# Patient Record
Sex: Male | Born: 1980 | Race: White | Hispanic: No | Smoking: Current every day smoker
Health system: Southern US, Community
[De-identification: ages and names within clinical notes are randomized; demographics above are authoritative.]

---

## 2000-07-24 ENCOUNTER — Emergency Department (HOSPITAL_COMMUNITY): Admission: EM | Admit: 2000-07-24 | Discharge: 2000-07-24 | Payer: Self-pay | Admitting: *Deleted

## 2001-10-18 ENCOUNTER — Emergency Department (HOSPITAL_COMMUNITY): Admission: EM | Admit: 2001-10-18 | Discharge: 2001-10-18 | Payer: Self-pay | Admitting: *Deleted

## 2001-10-18 ENCOUNTER — Encounter: Payer: Self-pay | Admitting: *Deleted

## 2008-04-06 ENCOUNTER — Emergency Department (HOSPITAL_COMMUNITY): Admission: EM | Admit: 2008-04-06 | Discharge: 2008-04-06 | Payer: Self-pay | Admitting: Emergency Medicine

## 2009-09-03 ENCOUNTER — Emergency Department (HOSPITAL_COMMUNITY): Admission: EM | Admit: 2009-09-03 | Discharge: 2009-09-03 | Payer: Self-pay | Admitting: Emergency Medicine

## 2009-09-03 ENCOUNTER — Inpatient Hospital Stay (HOSPITAL_COMMUNITY): Admission: RE | Admit: 2009-09-03 | Discharge: 2009-09-06 | Payer: Self-pay | Admitting: Psychiatry

## 2009-09-03 ENCOUNTER — Ambulatory Visit: Payer: Self-pay | Admitting: Psychiatry

## 2010-05-12 ENCOUNTER — Emergency Department (HOSPITAL_COMMUNITY): Admission: EM | Admit: 2010-05-12 | Discharge: 2010-05-13 | Payer: Self-pay | Admitting: Emergency Medicine

## 2011-02-27 LAB — CBC
HCT: 47 % (ref 39.0–52.0)
MCV: 97.8 fL (ref 78.0–100.0)
Platelets: 256 10*3/uL (ref 150–400)
RDW: 13.7 % (ref 11.5–15.5)

## 2011-02-27 LAB — BASIC METABOLIC PANEL
BUN: 8 mg/dL (ref 6–23)
CO2: 25 mEq/L (ref 19–32)
Calcium: 9.9 mg/dL (ref 8.4–10.5)
Chloride: 104 mEq/L (ref 96–112)
Creatinine, Ser: 0.79 mg/dL (ref 0.4–1.5)
GFR calc Af Amer: >60 mL/min (ref >60)
GFR calc non Af Amer: >60 mL/min (ref >60)
Glucose, Bld: 110 mg/dL — ABNORMAL HIGH (ref 70–99)
Potassium: 3.6 mEq/L (ref 3.5–5.1)
Sodium: 137 mEq/L (ref 135–145)

## 2011-02-27 LAB — THC (MARIJUANA), URINE, CONFIRMATION: Marijuana, Ur-Confirmation: 1600 ng/mL

## 2011-02-27 LAB — URINALYSIS, MICROSCOPIC ONLY
Glucose, UA: NEGATIVE mg/dL
Ketones, ur: >80 mg/dL — AB
Leukocytes, UA: NEGATIVE
pH: 5.5 (ref 5.0–8.0)

## 2011-02-27 LAB — DRUGS OF ABUSE SCREEN W/O ALC, ROUTINE URINE
Amphetamine Screen, Ur: NEGATIVE
Creatinine,U: 274.3 mg/dL
Methadone: NEGATIVE
Phencyclidine (PCP): NEGATIVE

## 2011-02-27 LAB — DIFFERENTIAL
Basophils Absolute: 0.1 10*3/uL (ref 0.0–0.1)
Eosinophils Absolute: 0.2 10*3/uL (ref 0.0–0.7)
Eosinophils Relative: 2 % (ref 0–5)

## 2011-02-27 LAB — BENZODIAZEPINE, QUANTITATIVE, URINE: Alprazolam (GC/LC/MS), ur confirm: 180 ng/mL

## 2011-02-27 LAB — ETHANOL: Alcohol, Ethyl (B): <5 mg/dL (ref 0–10)

## 2017-04-30 ENCOUNTER — Emergency Department (HOSPITAL_COMMUNITY): Payer: Self-pay

## 2017-04-30 ENCOUNTER — Encounter (HOSPITAL_COMMUNITY): Payer: Self-pay | Admitting: Emergency Medicine

## 2017-04-30 ENCOUNTER — Emergency Department (HOSPITAL_COMMUNITY)
Admission: EM | Admit: 2017-04-30 | Discharge: 2017-04-30 | Disposition: A | Discharge status: Home Health | Payer: Self-pay | Attending: Emergency Medicine | Admitting: Emergency Medicine

## 2017-04-30 DIAGNOSIS — M25562 Pain in left knee: Secondary | ICD-10-CM | POA: Insufficient documentation

## 2017-04-30 DIAGNOSIS — F17200 Nicotine dependence, unspecified, uncomplicated: Secondary | ICD-10-CM | POA: Insufficient documentation

## 2017-04-30 MED ORDER — IBUPROFEN 600 MG PO TABS: 600.0000 mg | ORAL_TABLET | Freq: Four times a day (QID) | ORAL | 0 refills | Status: AC | PRN

## 2017-04-30 NOTE — Discharge Instructions (Signed)
Wear the knee brace as needed, but do not wear to bed.  Call the orthopedic provider listed to arrange a follow-up appt. In one week if not improving

## 2017-04-30 NOTE — ED Triage Notes (Signed)
Patient complaining of swelling to left knee for over 1 week.

## 2017-04-30 NOTE — ED Provider Notes (Signed)
[facility] DEPT Provider Note   CSN: [id number] Arrival date & time: 04/30/17  1358     History   Chief Complaint Chief Complaint  Patient presents with  . Knee Pain    HPI Trish FountainMichael E Wunder is a 36 y.o. male.  HPI   Trish FountainMichael E Trine is a 36 y.o. male who presents to the Emergency Department complaining of left knee pain and swelling for one week.  Pain associated with bending and and palpation.  Symptoms began after working on his knees at his job.  Noticed a "knot" over his kneecap.  He denies fever, chills, redness of the joint and trauma.   History reviewed. No pertinent past medical history.  There are no active problems to display for this patient.   History reviewed. No pertinent surgical history.     Home Medications    Prior to Admission medications   Medication Sig Start Date End Date Taking? Authorizing Provider  ibuprofen (ADVIL,MOTRIN) 600 MG tablet Take 1 tablet (600 mg total) by mouth every 6 (six) hours as needed. Take with food 04/30/17   Pauline Ausriplett, Retha Bither, PA-C    Family History History reviewed. No pertinent family history.  Social History Social History  Substance Use Topics  . Smoking status: Current Every Day Smoker  . Smokeless tobacco: Never Used  . Alcohol use No     Allergies   Imodium [loperamide]   Review of Systems Review of Systems  Constitutional: Negative for chills and fever.  Musculoskeletal: Positive for arthralgias (left knee pain) and joint swelling.  Skin: Negative for color change and wound.  All other systems reviewed and are negative.    Physical Exam Updated Vital Signs BP (!) 138/95 (BP Location: Right Arm)   Pulse 73   Temp 98.2 F (36.8 C) (Oral)   Resp 16   Ht 5\' 11"  (1.803 m)   Wt 62.6 kg (138 lb)   SpO2 98%   BMI 19.25 kg/m   Physical Exam  Constitutional: He is oriented to person, place, and time. He appears well-developed and well-nourished. No distress.  Cardiovascular: Normal rate,  regular rhythm, normal heart sounds and intact distal pulses.   Pulmonary/Chest: Effort normal and breath sounds normal.  Musculoskeletal: Normal range of motion. He exhibits edema and tenderness. He exhibits no deformity.  ttp of anterior left knee with small amt of edema overlying the patella.  No erythema, effusion, or step-off deformity.   Calf is soft and NT.  Neurological: He is alert and oriented to person, place, and time. He exhibits normal muscle tone. Coordination normal.  Skin: Skin is warm and dry. Capillary refill takes less than 2 seconds. No erythema.  Nursing note and vitals reviewed.    ED Treatments / Results  Labs (all labs ordered are listed, but only abnormal results are displayed) Labs Reviewed - No data to display  EKG  EKG Interpretation None       Radiology Dg Knee Complete 4 Views Left  Result Date: 04/30/2017 CLINICAL DATA:  Left knee pain and swelling without known injury. EXAM: LEFT KNEE - COMPLETE 4+ VIEW COMPARISON:  None. FINDINGS: No evidence of fracture, dislocation, or joint effusion. No evidence of arthropathy or other focal bone abnormality. Soft tissues are unremarkable. IMPRESSION: Normal left knee. Electronically Signed   By: Lupita RaiderJames  Green Jr, M.D.   On: 04/30/2017 15:13    Procedures Procedures (including critical care time)  Medications Ordered in ED Medications - No data to display   Initial Impression /  Assessment and Plan / ED Course  I have reviewed the triage vital signs and the nursing notes.  Pertinent labs & imaging results that were available during my care of the patient were reviewed by me and considered in my medical decision making (see chart for details).     Small amt of prepatellar edema w/o concerning sx's for cellulitis or septic joint.  NV intact.  Pain likely related to prepatellar bursitis.    Knee sleeve applied.  Pt agrees to ice, compression to the knee and NSAID.  referral to orthopedics   Final Clinical  Impressions(s) / ED Diagnoses   Final diagnoses:  Acute pain of left knee    New Prescriptions Discharge Medication List as of 04/30/2017  3:48 PM    START taking these medications   Details  ibuprofen (ADVIL,MOTRIN) 600 MG tablet Take 1 tablet (600 mg total) by mouth every 6 (six) hours as needed. Take with food, Starting Thu 04/30/2017, Print         Pauline Aus, PA-C 04/30/17 2120    Eber Hong, MD 05/01/17 [phone]

## 2018-02-19 IMAGING — DX DG KNEE COMPLETE 4+V*L*
4 series · 4 of 4 positions shown · non-contrast
Comparison: None.

CLINICAL DATA: Left knee pain and swelling without known injury.

EXAM:
LEFT KNEE - COMPLETE 4+ VIEW

[knee ap (1 of 3)]
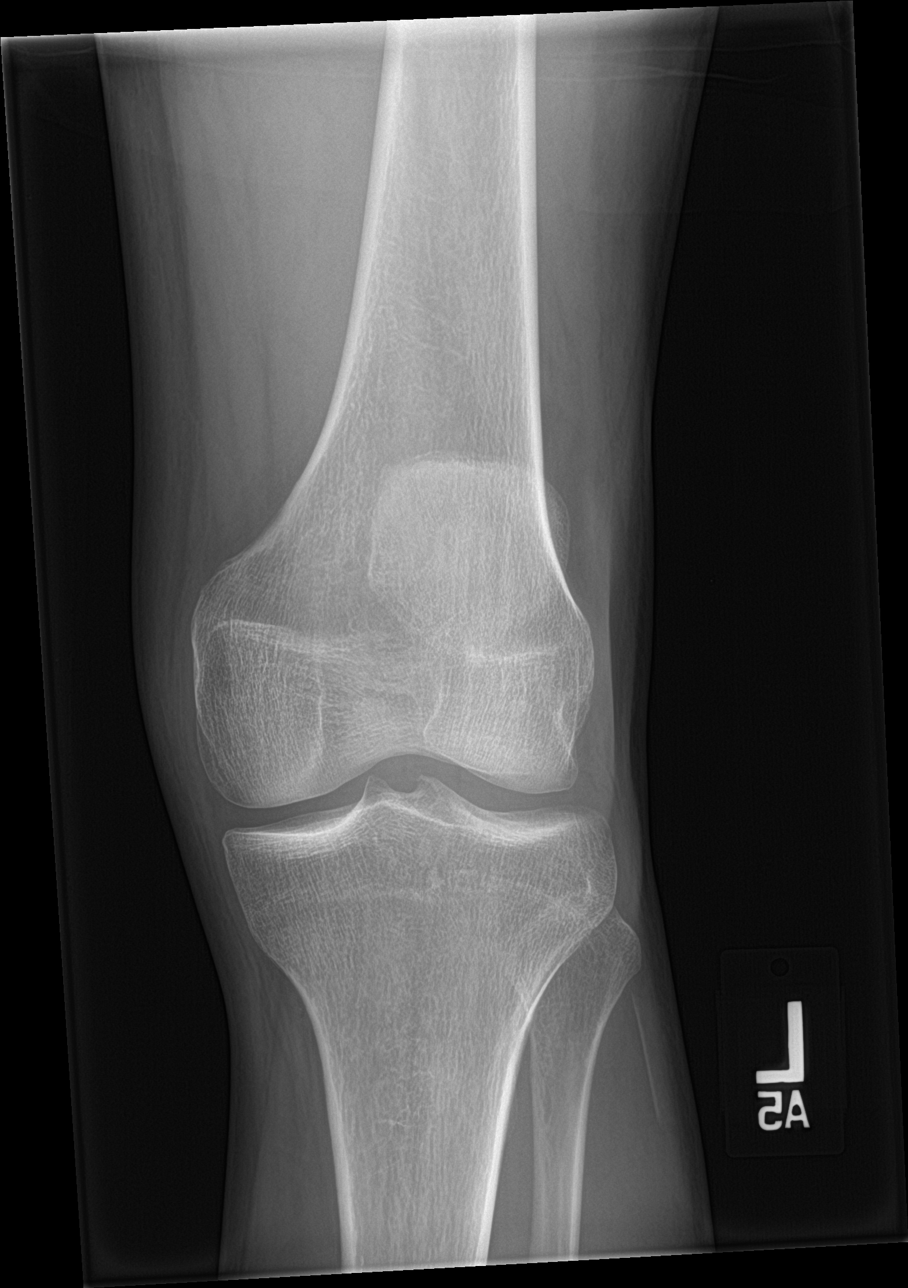

[knee ap (2 of 3)]
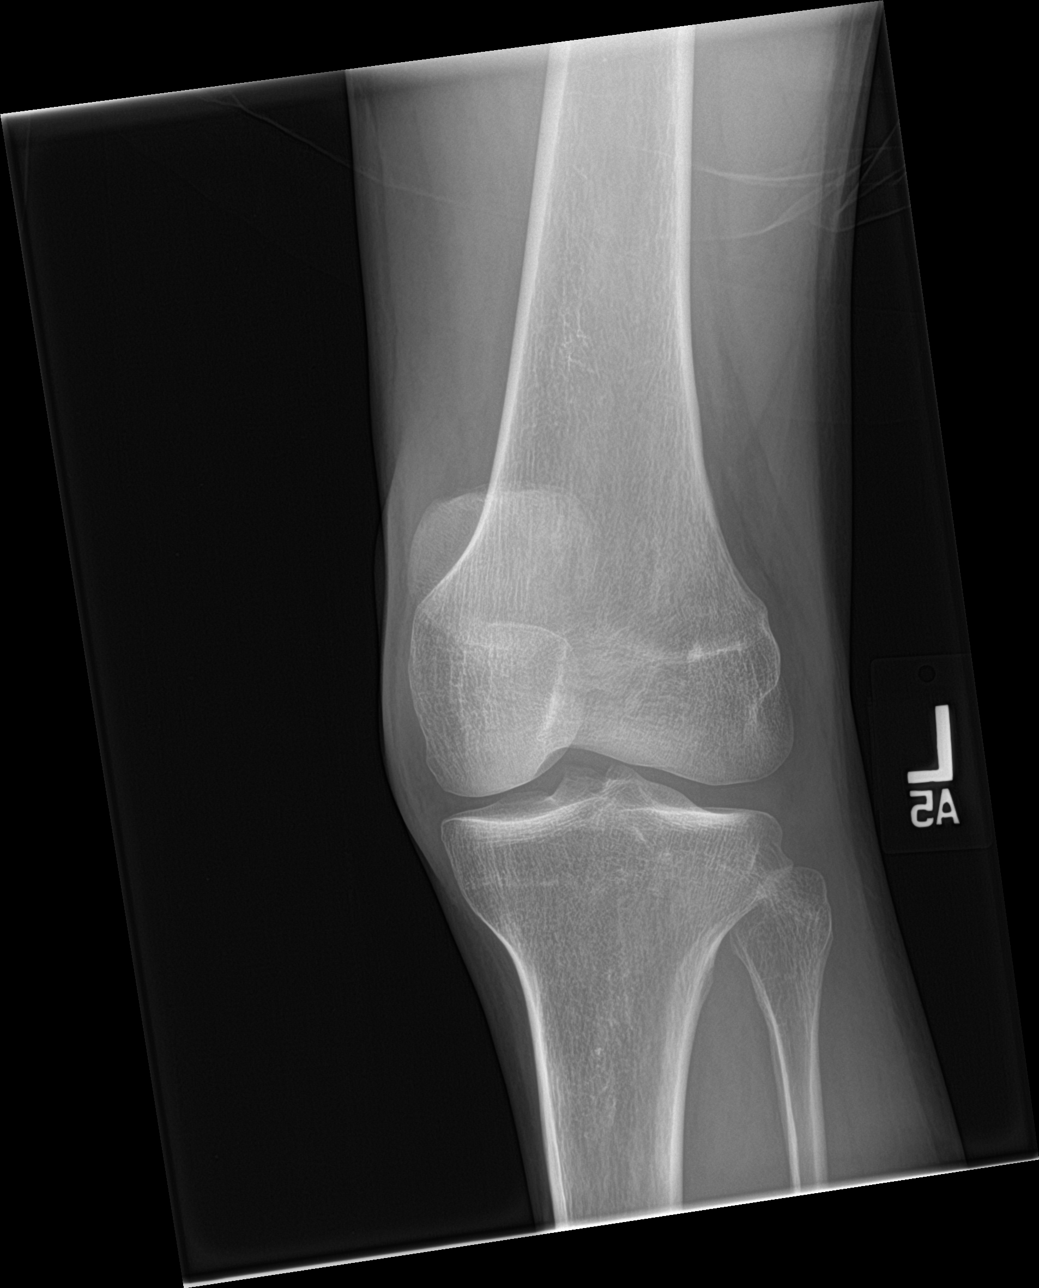

[knee ap (3 of 3)]
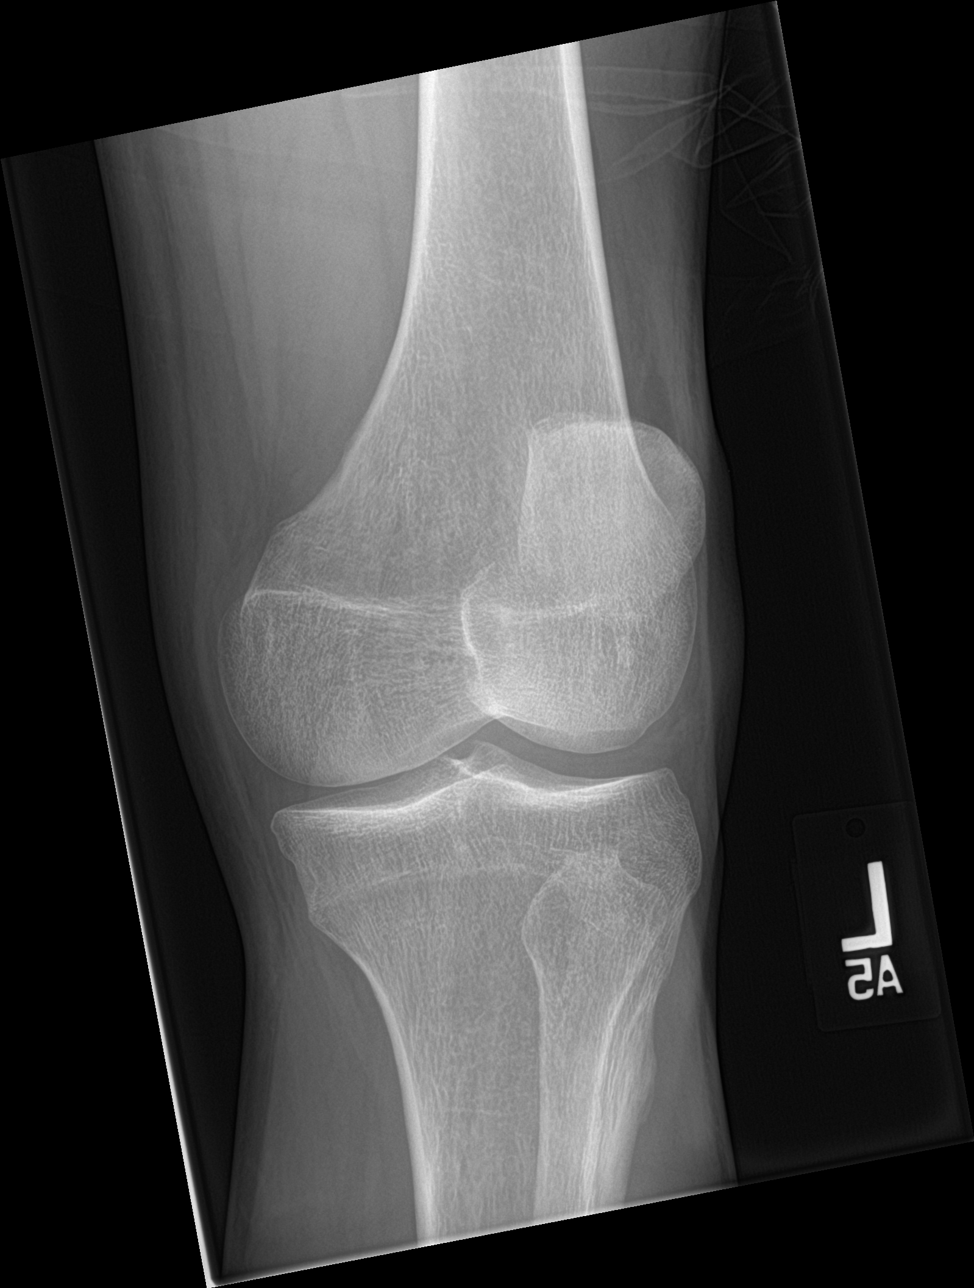

[knee lat]
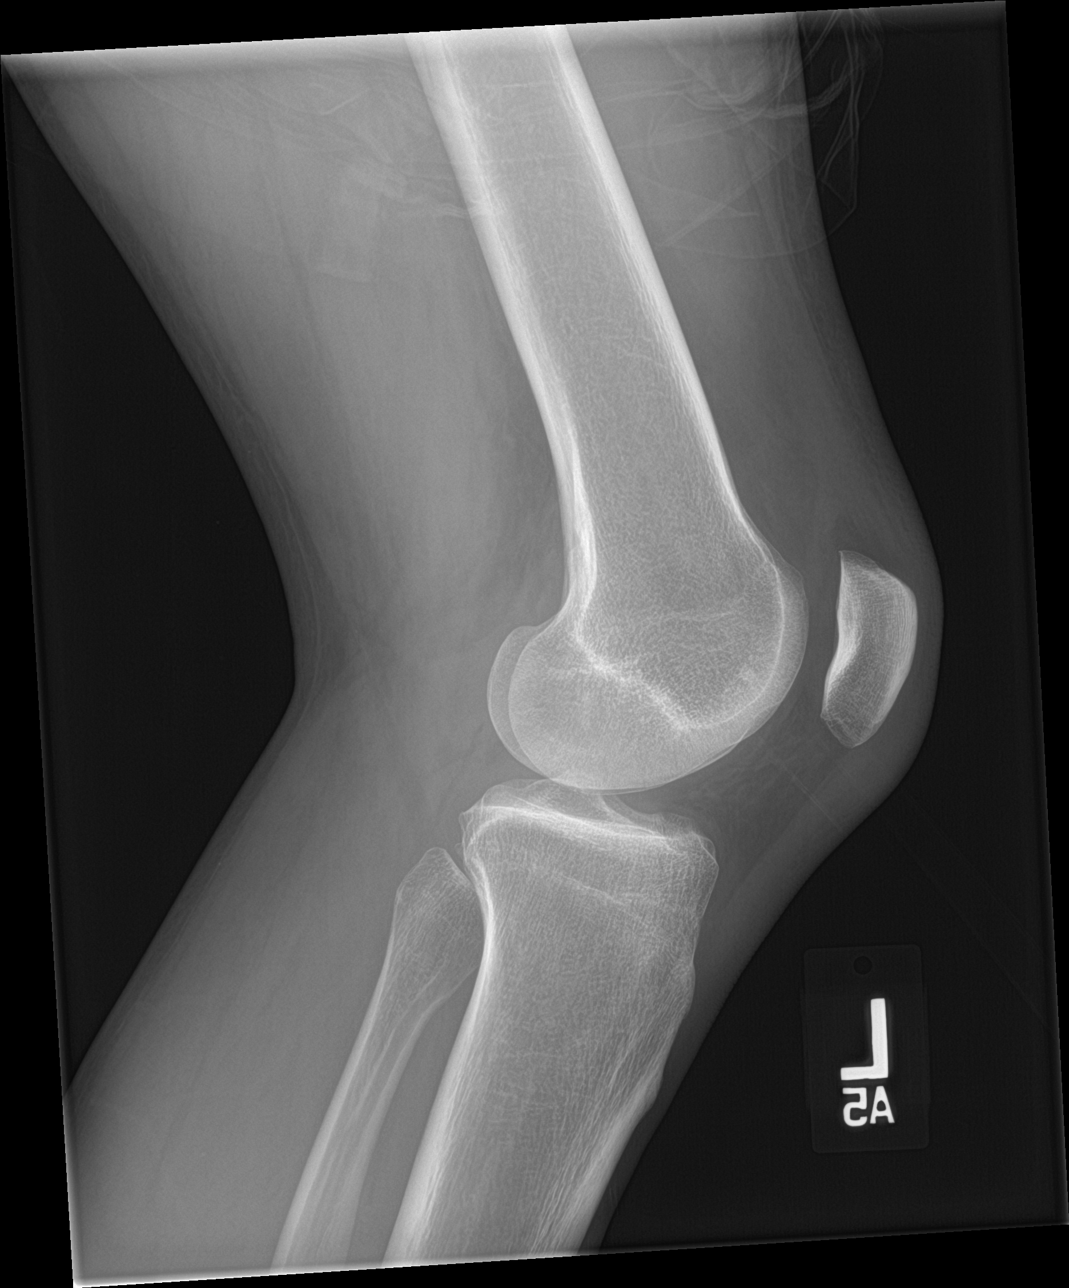

[4 of 4 positions shown; findings below may reference images not displayed]

FINDINGS: No evidence of fracture, dislocation, or joint effusion. No evidence
of arthropathy or other focal bone abnormality. Soft tissues are
unremarkable.
IMPRESSION: Normal left knee.

## 2023-07-17 ENCOUNTER — Ambulatory Visit: Payer: Self-pay

## 2023-07-17 NOTE — Telephone Encounter (Signed)
Chief Complaint: Depression - bi-polar Symptoms: Quit job, not eating, Not doing anything - gotten much better today Frequency: all his life - much worse since monday Pertinent Negatives: Patient denies  Disposition: [] ED /[] Urgent Care (no appt availability in office) / [x] Appointment(In office/virtual)/ []  [facility]/ [] Home Care/ [] Refused Recommended Disposition /[] [facility]/ []  Follow-up with PCP Additional Notes: Spoke with pt's girlfriend, Chief Technology Officer. Lawanna Kobus states that pt has been diagnosed with depression - bi-polar disorder. Pt has not been on meds for several years. Earlier this week, pt drove to work in [employer] car, quit his job and walked home. Pt stopped eating and spent a lot of time just sitting on the back porch.  A co-worker and friend came by yesterday and spent time with pt. Pt starting to return to normal. Co-worker and boss have stood by pt at other times when he has had events. Louann Sjogren has offered to pay for pt's medical visits and medicine.  After co-worker left, pt started to get better. Pt ate, put together a newly purchased grill, and went to work at his 2nd job today. Pt called Lawanna Kobus and asked her to set up a new pt appt. NP appt is Monday at 9AM with Dr. Andrey Campanile. Lawanna Kobus feels that pt is doing better.  Lawanna Kobus will continue to monitor pt. I have given her the phone numbers for [facility], the [facility] hotline and for National Suicide prevention Hotline.  Lawanna Kobus will call one of these or 911 if needed over the weekend.   Reason for Disposition  Requesting to talk with a counselor (mental health worker, psychiatrist, etc.)  Answer Assessment - Initial Assessment Questions 1. CONCERN: "What happened that made you call today?"     Pt drove to the office and quit, and walked home - 6 hour walk. 2. DEPRESSION SYMPTOM SCREENING: "How are you feeling overall?" (e.g., decreased energy, increased sleeping or difficulty sleeping, difficulty concentrating, feelings of sadness,  guilt, hopelessness, or worthlessness)     A better. 3. RISK OF HARM - SUICIDAL IDEATION:  "Do you ever have thoughts of hurting or killing yourself?"  (e.g., yes, no, no but preoccupation with thoughts about death)   - INTENT:  "Do you have thoughts of hurting or killing yourself right NOW?" (e.g., yes, no, N/A)   - PLAN: "Do you have a specific plan for how you would do this?" (e.g., gun, knife, overdose, no plan, N/A)     No - sounds better - asking for help 4. RISK OF HARM - HOMICIDAL IDEATION:  "Do you ever have thoughts of hurting or killing someone else?"  (e.g., yes, no, no but preoccupation with thoughts about death)   - INTENT:  "Do you have thoughts of hurting or killing someone right NOW?" (e.g., yes, no, N/A)   - PLAN: "Do you have a specific plan for how you would do this?" (e.g., gun, knife, no plan, N/A)      no 5. FUNCTIONAL IMPAIRMENT: "How have things been going for you overall? Have you had more difficulty than usual doing your normal daily activities?"  (e.g., better, same, worse; self-care, school, work, interactions)     Getting  6. SUPPORT: "Who is with you now?" "Who do you live with?" "Do you have family or friends who you can talk to?"     Pennie Rushing 7. THERAPIST: "Do you have a counselor or therapist? Name?"     no 8. STRESSORS: "Has there been any new stress or recent  changes in your life?"     Nothing new 9. ALCOHOL USE OR SUBSTANCE USE (DRUG USE): "Do you drink alcohol or use any illegal drugs?"     Self medicate with marijuana 10. OTHER: "Do you have any other physical symptoms right now?" (e.g., fever)       Quit work  Protocols used: [employer]

## 2023-07-20 ENCOUNTER — Encounter: Payer: Self-pay | Admitting: Family Medicine

## 2023-07-20 ENCOUNTER — Ambulatory Visit (INDEPENDENT_AMBULATORY_CARE_PROVIDER_SITE_OTHER): Payer: Self-pay | Admitting: Family Medicine

## 2023-07-20 VITALS — BP 113/66 | HR 58 | Temp 97.7°F | Resp 16 | Ht 71.0 in | Wt 134.8 lb

## 2023-07-20 DIAGNOSIS — F3160 Bipolar disorder, current episode mixed, unspecified: Secondary | ICD-10-CM

## 2023-07-20 DIAGNOSIS — Z23 Encounter for immunization: Secondary | ICD-10-CM

## 2023-07-20 DIAGNOSIS — F17200 Nicotine dependence, unspecified, uncomplicated: Secondary | ICD-10-CM

## 2023-07-20 DIAGNOSIS — Z7689 Persons encountering health services in other specified circumstances: Secondary | ICD-10-CM

## 2023-07-20 MED ORDER — QUETIAPINE FUMARATE 50 MG PO TABS: 50.0000 mg | ORAL_TABLET | Freq: Every day | ORAL | 1 refills | Status: AC

## 2023-07-20 NOTE — Progress Notes (Unsigned)
Establish care

## 2023-07-21 ENCOUNTER — Encounter: Payer: Self-pay | Admitting: Family Medicine

## 2023-07-21 NOTE — Progress Notes (Addendum)
New Patient Office Visit  Subjective    Patient ID: Douglas Huff, male    DOB: 1981-09-05  Age: 42 y.o. MRN: [id number]  CC:  Chief Complaint  Patient presents with   Establish Care    HPI Douglas Huff presents to establish care and for review of chronic med issues. Patient reports that he has been treated for bipolar in the past (15 years ago or so). He believes that he has sx again and would like to possible be started again on meds. He does not remember what meds he was on in the past.    Outpatient Encounter Medications as of 07/20/2023  Medication Sig   QUEtiapine (SEROQUEL) 50 MG tablet Take 1 tablet (50 mg total) by mouth at bedtime.   ibuprofen (ADVIL,MOTRIN) 600 MG tablet Take 1 tablet (600 mg total) by mouth every 6 (six) hours as needed. Take with food   No facility-administered encounter medications on file as of 07/20/2023.    History reviewed. No pertinent past medical history.  History reviewed. No pertinent surgical history.  History reviewed. No pertinent family history.  Social History   Socioeconomic History   Marital status: Legally Separated    Spouse name: Not on file   Number of children: Not on file   Years of education: Not on file   Highest education level: Not on file  Occupational History   Not on file  Tobacco Use   Smoking status: Every Day   Smokeless tobacco: Never  Substance and Sexual Activity   Alcohol use: No   Drug use: Yes    Types: Marijuana   Sexual activity: Not on file  Other Topics Concern   Not on file  Social History Narrative   Not on file   Social Determinants of Health   Financial Resource Strain: Not on file  Food Insecurity: Not on file  Transportation Needs: Not on file  Physical Activity: Not on file  Stress: Not on file  Social Connections: Not on file  Intimate Partner Violence: Not on file    Review of Systems  Psychiatric/Behavioral:  Positive for depression. Negative for substance abuse.  The patient is nervous/anxious.   All other systems reviewed and are negative.       Objective    BP 113/66 (BP Location: Left Arm, Patient Position: Sitting, Cuff Size: Normal)   Pulse (!) 58   Temp 97.7 F (36.5 C) (Oral)   Resp 16   Ht 5\' 11"  (1.803 m)   Wt 134 lb 12.8 oz (61.1 kg)   SpO2 98%   BMI 18.80 kg/m   Physical Exam Vitals and nursing note reviewed.  Constitutional:      General: He is not in acute distress. Cardiovascular:     Rate and Rhythm: Normal rate and regular rhythm.  Pulmonary:     Effort: Pulmonary effort is normal.     Breath sounds: Normal breath sounds.  Neurological:     General: No focal deficit present.     Mental Status: He is alert and oriented to person, place, and time.  Psychiatric:        Mood and Affect: Mood is anxious.        Speech: Speech normal.        Behavior: Behavior normal.         Assessment & Plan:   1. Bipolar affective disorder, current episode mixed, current episode severity unspecified (HCC) Prescribed seroquel 50 mg at hs. Patient wants to  consider his options for [facility] as he does not have insurance at this time. Will consider referral to [facility] for further eval/mgt  2. Smoking Discussed reduction/cessation  3. Need for vaccination  - Tdap vaccine greater than or equal to 7yo IM  4. Encounter to establish care     Return in about 4 weeks (around 08/17/2023) for follow up.   Tommie Raymond, MD

## 2023-08-10 ENCOUNTER — Ambulatory Visit: Payer: Self-pay | Admitting: Family Medicine

## 2023-12-17 ENCOUNTER — Telehealth: Payer: Self-pay | Admitting: Family Medicine

## 2023-12-17 NOTE — Telephone Encounter (Signed)
Called patient after speaking to the practice leader, due to the severity of his condition he will need to be seen at the ED . Patient's partner asked about [facility] if he was approved and I gave her the billing department number to confirm financials.

## 2023-12-17 NOTE — Telephone Encounter (Signed)
Copied from CRM [id number]. Topic: Appointments - Appointment Scheduling >> Dec 17, 2023  1:11 PM Gildardo Pounds wrote: Patient needs appt for his right shoulder which starting hurting and radiating down his arm into his hand. Now it is numb going down the arm. Can he be seen again under [facility]. Patient did not request an appt but wondering would an appt be covered. Callback number [phone]
